# Patient Record
Sex: Female | Born: 1980 | Race: White | Hispanic: Yes | Marital: Married | Smoking: Never smoker
Health system: Southern US, Community
[De-identification: ages and names within clinical notes are randomized; demographics above are authoritative.]

## PROBLEM LIST (undated history)

## (undated) DIAGNOSIS — O24419 Gestational diabetes mellitus in pregnancy, unspecified control: Secondary | ICD-10-CM

## (undated) DIAGNOSIS — A630 Anogenital (venereal) warts: Secondary | ICD-10-CM

## (undated) HISTORY — DX: Anogenital (venereal) warts: A63.0

---

## 2016-06-26 HISTORY — PX: CHOLECYSTECTOMY: SHX55

## 2017-06-26 NOTE — L&D Delivery Note (Addendum)
Patient: Jennifer Bowman MRN: [id number]  GBS status: GBS(+), amp given but incompletely treated  Patient is a 37 y.o. now [id number] s/p NSVD at [redacted]w[redacted]d, who was admitted for SOL. AROM 0h 18m prior to delivery with clear fluid.    Delivery Note At 7:29 AM a viable female was delivered via Vaginal, Spontaneous (Presentation: vertex, ROA).  APGAR: 9, 9; weight  pending.   Placenta status: intact.  Cord: 3 vessel, knotted  with the following complications: PPH 500cc tx w/ methergine.    Anesthesia:  IV fentanyl, local lidocaine Episiotomy: None Lacerations: 2nd degree Suture Repair: 3.0 vicryl Est. Blood Loss (mL): 416  Mom to postpartum.  Baby to Couplet care / Skin to Skin.  JACOB E PERRIN 04/28/2018, 8:07 AM    Head delivered ROA. No nuchal cord present. Shoulder and body delivered in usual fashion. Infant with spontaneous cry, placed on mother's abdomen, dried and bulb suctioned. Cord clamped x 2 after 1-minute delay, and cut by family member. Cord blood drawn. Placenta delivered spontaneously with gentle cord traction. Methergine given for brisk bleeding and history of severe PPH.  Fundus firm with massage and Pitocin. Perineum inspected and found to have 2nd degree laceration which was repaired with 3.0 vicryl with good hemostasis achieved.  OB FELLOW DELIVERY ATTESTATION  I was gloved and present for the delivery in its entirety, and I agree with the above resident's note.    Gwenevere Abbot, MD OB Fellow  04/28/2018, 8:10 AM

## 2018-02-18 LAB — CYTOLOGY - PAP
Cystic Fibrosis Profile: NEGATIVE
DRUG SCREEN, URINE: NEGATIVE
Glucose 1 Hour: 159
Pap Smear: NEGATIVE

## 2018-02-18 LAB — OB RESULTS CONSOLE ABO/RH: RH TYPE: POSITIVE

## 2018-02-18 LAB — OB RESULTS CONSOLE RPR: RPR: NONREACTIVE

## 2018-02-18 LAB — OB RESULTS CONSOLE HGB/HCT, BLOOD
HEMATOCRIT: 35
HEMOGLOBIN: 11.4

## 2018-02-18 LAB — OB RESULTS CONSOLE PLATELET COUNT: Platelets: 266

## 2018-02-18 LAB — OB RESULTS CONSOLE VARICELLA ZOSTER ANTIBODY, IGG: Varicella: IMMUNE

## 2018-02-18 LAB — OB RESULTS CONSOLE GC/CHLAMYDIA
Chlamydia: NEGATIVE
Gonorrhea: NEGATIVE

## 2018-02-18 LAB — OB RESULTS CONSOLE RUBELLA ANTIBODY, IGM: Rubella: IMMUNE

## 2018-02-18 LAB — OB RESULTS CONSOLE HEPATITIS B SURFACE ANTIGEN: Hepatitis B Surface Ag: NEGATIVE

## 2018-02-18 LAB — OB RESULTS CONSOLE ANTIBODY SCREEN: Antibody Screen: NEGATIVE

## 2018-03-07 ENCOUNTER — Encounter: Payer: Self-pay | Admitting: Obstetrics & Gynecology

## 2018-03-07 ENCOUNTER — Encounter: Payer: Self-pay | Admitting: *Deleted

## 2018-03-07 ENCOUNTER — Ambulatory Visit (INDEPENDENT_AMBULATORY_CARE_PROVIDER_SITE_OTHER): Payer: Self-pay | Admitting: Obstetrics & Gynecology

## 2018-03-07 DIAGNOSIS — O09299 Supervision of pregnancy with other poor reproductive or obstetric history, unspecified trimester: Secondary | ICD-10-CM | POA: Insufficient documentation

## 2018-03-07 DIAGNOSIS — O0990 Supervision of high risk pregnancy, unspecified, unspecified trimester: Secondary | ICD-10-CM

## 2018-03-07 DIAGNOSIS — O09529 Supervision of elderly multigravida, unspecified trimester: Secondary | ICD-10-CM

## 2018-03-07 DIAGNOSIS — R8271 Bacteriuria: Secondary | ICD-10-CM | POA: Insufficient documentation

## 2018-03-07 DIAGNOSIS — O09523 Supervision of elderly multigravida, third trimester: Secondary | ICD-10-CM

## 2018-03-07 DIAGNOSIS — O0993 Supervision of high risk pregnancy, unspecified, third trimester: Secondary | ICD-10-CM

## 2018-03-07 DIAGNOSIS — O09293 Supervision of pregnancy with other poor reproductive or obstetric history, third trimester: Secondary | ICD-10-CM

## 2018-03-07 LAB — POCT URINALYSIS DIP (DEVICE)
BILIRUBIN URINE: NEGATIVE
Glucose, UA: NEGATIVE mg/dL
KETONES UR: NEGATIVE mg/dL
Leukocytes, UA: NEGATIVE
Nitrite: NEGATIVE
PH: 6.5 (ref 5.0–8.0)
Protein, ur: NEGATIVE mg/dL
Specific Gravity, Urine: 1.01 (ref 1.005–1.030)
Urobilinogen, UA: 0.2 mg/dL (ref 0.0–1.0)

## 2018-03-07 NOTE — Patient Instructions (Signed)

## 2018-03-07 NOTE — BH Specialist Note (Deleted)
Integrated Behavioral Health Initial Visit  MRN: [id number] Name: Jennifer Bowman  Number of Integrated Behavioral Health Clinician visits:: 1/6 Session Start time: ***  Session End time: *** Total time: {IBH Total Time:21014050}  Type of Service: Integrated Behavioral Health- Individual/Family Interpretor:Yes.   Interpretor Name and Language: Spanish ***   Warm Hand Off Completed.       SUBJECTIVE: Jennifer Bowman is a 37 y.o. female accompanied by {CHL AMB ACCOMPANIED WU:[id number]}[phone]} Patient was referred by Scheryl DarterJames Arnold, MD for Initial OB introduction to integrated behavioral health services . Patient reports the following symptoms/concerns: *** Duration of problem: ***; Severity of problem: {Mild/Moderate/Severe:20260}  OBJECTIVE: Mood: {BHH MOOD:22306} and Affect: {BHH AFFECT:22307} Risk of harm to self or others: {CHL AMB BH Suicide Current Mental Status:21022748}  LIFE CONTEXT: Family and Social: *** School/Work: *** Self-Care: *** Life Changes: Current pregnancy ***  GOALS ADDRESSED: Patient will: 1. Reduce symptoms of: {IBH Symptoms:21014056} 2. Increase knowledge and/or ability of: {IBH Patient Tools:21014057}  3. Demonstrate ability to: {IBH Goals:21014053}  INTERVENTIONS: Interventions utilized: {IBH Interventions:21014054}  Standardized Assessments completed: GAD-7 and PHQ 9  ASSESSMENT: Patient currently experiencing Supervision of *** pregnancy, ***.   Patient may benefit from Initial OB introduction to integrated behavioral health services.  PLAN: 1. Follow up with behavioral health clinician on : *** 2. Behavioral recommendations: *** 3. Referral(s): {IBH Referrals:21014055} 4. "From scale of 1-10, how likely are you to follow plan?": ***  Valetta CloseJamie C Delainie Chavana, LCSW  ***

## 2018-03-07 NOTE — Progress Notes (Signed)
Here for initial prenatal visit- [facility]. Given new patient packets. C/o occasional contractions daily 2-3 times.

## 2018-03-07 NOTE — Progress Notes (Signed)
Subjective:    Jennifer Bowman is a [id number] [redacted]w[redacted]d being seen today for her first obstetrical visit after transfer from [facility] for history of fetal demise with a baby with anomalies, subsequent normal deliveries Her obstetrical history is significant for advanced maternal age and fetal demise and anomalies. Patient does intend to breast feed. Pregnancy history fully reviewed.  Patient reports occasional contractions.  Vitals:   04-05-18 1056 05-Apr-2018 1124  BP: 110/65   Pulse: 86   Weight: 169 lb 11.2 oz (77 kg)   Height:  5\' 3"  (1.6 m)    HISTORY: OB History  Gravida Para Term Preterm AB Living  5 4 3 1   3   SAB TAB Ectopic Multiple Live Births          3    # Outcome Date GA Lbr Len/2nd Weight Sex Delivery Anes PTL Lv  5 Current           4 Term 10/11/08 [redacted]w[redacted]d  8 lb (3.629 kg) M Vag-Spont   LIV     Birth Comments: had outbreak of herpes, pp hemorrhage, received blood, GDM  3 Term 03/04/05 [redacted]w[redacted]d  8 lb 8 oz (3.856 kg)  Vag-Spont   LIV  2 Preterm 06/12/02 [redacted]w[redacted]d    Vag-Spont   FD     Birth Comments: born stillbirth due to fetal anomalies and not allowed to see baby  1 Term 06/04/99 [redacted]w[redacted]d  6 lb 9.8 oz (3 kg)  Vag-Spont   LIV     Birth Comments: N&V    Past Medical History:  Diagnosis Date  . GDM (gestational diabetes mellitus)   . Genital warts    Past Surgical History:  Procedure Laterality Date  . CHOLECYSTECTOMY  2018   Family History  Problem Relation Age of Onset  . Hypertension Sister   . Diabetes Sister   . Heart disease Sister   . Seizures Sister   . Hepatitis Sister   . Thyroid disease Sister      Exam    Uterus:     Pelvic Exam:    Perineum: No Hemorrhoids   Vulva: normal   Vagina:  normal mucosa   pH:     Cervix: no lesions   Adnexa: not evaluated   Bony Pelvis: average       Skin: normal coloration and turgor, no rashes    Neurologic: oriented, normal mood   Extremities: normal strength, tone, and muscle mass   HEENT PERRLA and neck  supple with midline trachea   Mouth/Teeth dental hygiene good   Neck supple   Cardiovascular: regular rate and rhythm   Respiratory:  appears well, vitals normal, no respiratory distress, acyanotic, normal RR   Abdomen: gravid   Urinary: urethral meatus normal      Assessment:    Pregnancy: [id number] Patient Active Problem List   Diagnosis Date Noted  . Supervision of high risk pregnancy, antepartum 04/05/18  . Group B streptococcal bacteriuria 04/05/2018  . History of fetal abnormality in previous pregnancy, currently pregnant Apr 05, 2018  . AMA (advanced maternal age) multigravida 35+ 2018/04/05    Fetal death was associated with anomalies, she has no documentation    Plan:     Initial labs drawn. Prenatal vitamins. Problem list reviewed and updated. Genetic Screening discussed Quad Screen: results reviewed.  Ultrasound discussed; fetal survey: results reviewed.  Follow up in 2 weeks. 50% of 30 min visit spent on counseling and coordination of care.  Will order MFM [place] for growth, review anatomy  Scheryl DarterJames Zimri Brennen 03/07/2018

## 2018-03-11 ENCOUNTER — Ambulatory Visit (HOSPITAL_COMMUNITY)
Admission: RE | Admit: 2018-03-11 | Discharge: 2018-03-11 | Disposition: A | Discharge status: Home / Self Care | Payer: Self-pay | Source: Ambulatory Visit | Attending: Obstetrics & Gynecology | Admitting: Obstetrics & Gynecology

## 2018-03-11 ENCOUNTER — Encounter (HOSPITAL_COMMUNITY): Payer: Self-pay

## 2018-03-11 DIAGNOSIS — Z363 Encounter for antenatal screening for malformations: Secondary | ICD-10-CM

## 2018-03-11 DIAGNOSIS — O09299 Supervision of pregnancy with other poor reproductive or obstetric history, unspecified trimester: Secondary | ICD-10-CM

## 2018-03-11 DIAGNOSIS — O09523 Supervision of elderly multigravida, third trimester: Secondary | ICD-10-CM | POA: Insufficient documentation

## 2018-03-11 DIAGNOSIS — O0990 Supervision of high risk pregnancy, unspecified, unspecified trimester: Secondary | ICD-10-CM

## 2018-03-11 DIAGNOSIS — O09293 Supervision of pregnancy with other poor reproductive or obstetric history, third trimester: Secondary | ICD-10-CM | POA: Insufficient documentation

## 2018-03-11 DIAGNOSIS — O0993 Supervision of high risk pregnancy, unspecified, third trimester: Secondary | ICD-10-CM | POA: Insufficient documentation

## 2018-03-11 DIAGNOSIS — Z3A33 33 weeks gestation of pregnancy: Secondary | ICD-10-CM | POA: Insufficient documentation

## 2018-03-11 DIAGNOSIS — O09529 Supervision of elderly multigravida, unspecified trimester: Secondary | ICD-10-CM

## 2018-03-11 HISTORY — DX: Gestational diabetes mellitus in pregnancy, unspecified control: O24.419

## 2018-03-12 ENCOUNTER — Other Ambulatory Visit (HOSPITAL_COMMUNITY): Payer: Self-pay | Admitting: *Deleted

## 2018-03-12 DIAGNOSIS — O09529 Supervision of elderly multigravida, unspecified trimester: Secondary | ICD-10-CM

## 2018-03-20 ENCOUNTER — Ambulatory Visit (INDEPENDENT_AMBULATORY_CARE_PROVIDER_SITE_OTHER): Payer: Self-pay | Admitting: Family Medicine

## 2018-03-20 VITALS — BP 117/70 | HR 97 | Wt 172.0 lb

## 2018-03-20 DIAGNOSIS — N898 Other specified noninflammatory disorders of vagina: Secondary | ICD-10-CM

## 2018-03-20 DIAGNOSIS — O98313 Other infections with a predominantly sexual mode of transmission complicating pregnancy, third trimester: Secondary | ICD-10-CM

## 2018-03-20 DIAGNOSIS — A6009 Herpesviral infection of other urogenital tract: Secondary | ICD-10-CM

## 2018-03-20 DIAGNOSIS — Z23 Encounter for immunization: Secondary | ICD-10-CM

## 2018-03-20 DIAGNOSIS — O09529 Supervision of elderly multigravida, unspecified trimester: Secondary | ICD-10-CM

## 2018-03-20 DIAGNOSIS — Z113 Encounter for screening for infections with a predominantly sexual mode of transmission: Secondary | ICD-10-CM

## 2018-03-20 DIAGNOSIS — N9089 Other specified noninflammatory disorders of vulva and perineum: Secondary | ICD-10-CM

## 2018-03-20 DIAGNOSIS — O2340 Unspecified infection of urinary tract in pregnancy, unspecified trimester: Secondary | ICD-10-CM

## 2018-03-20 DIAGNOSIS — R8271 Bacteriuria: Secondary | ICD-10-CM

## 2018-03-20 DIAGNOSIS — O0990 Supervision of high risk pregnancy, unspecified, unspecified trimester: Secondary | ICD-10-CM

## 2018-03-20 NOTE — Progress Notes (Signed)
   PRENATAL VISIT NOTE  Subjective:  Jennifer Bowman is a 37 y.o. [id number] at 160w3d being seen today for ongoing prenatal care.  She is currently monitored for the following issues for this high-risk pregnancy and has Supervision of high risk pregnancy, antepartum; Group B streptococcal bacteriuria; History of fetal abnormality in previous pregnancy, currently pregnant; and AMA (advanced maternal age) multigravida 35+ on their problem list.  - will plan to get flu vaccine at HD  - tear in vagina ? - burning with wiping, no d/c or bleeding  - when had daughter (13yo) - large tear in vaginal area  - hx of herpes - on suppression, this pain feels different   Patient reports vaginal burning, lower pelvic pain.  Contractions: Irregular. Vag. Bleeding: None.  Movement: Present. Denies leaking of fluid.   The following portions of the patient's history were reviewed and updated as appropriate: allergies, current medications, past family history, past medical history, past social history, past surgical history and problem list. Problem list updated.  Objective:   Vitals:   03/20/18 1424  BP: 117/70  Pulse: 97  Weight: 172 lb (78 kg)    Fetal Status: Fetal Heart Rate (bpm): 146   Movement: Present     General:  Alert, oriented and cooperative. Patient is in no acute distress.  Skin: Skin is warm and dry. No rash noted.   Cardiovascular: Normal heart rate noted  Respiratory: Normal respiratory effort, no problems with respiration noted  Abdomen: Soft, gravid, appropriate for gestational age.  Pain/Pressure: Present     Pelvic: Cervical exam deferred      Sterile speculum exam with scant white/clear discharge, cervix visually closed, normal-appearing vagina  two small superficial lacerations just at entry to vagina, no bleeding    Extremities: Normal range of motion.  Edema: Trace  Mental Status: Normal mood and affect. Normal behavior. Normal judgment and thought content.   Assessment  and Plan:  Pregnancy: [id number] at 3060w3d  1. Supervision of high risk pregnancy, antepartum -- prenatal record reviewed and UTD -- Tdap vaccine greater than or equal to 7yo IM -- flu vaccine at HD   2. Asymptomatic Bacteriuria (GBS): Treated with amoxicillin early in pregnancy. -- repeat urine culture   3. Vaginal Discharge: Low suspicion for infection. Follow-up G/C, wet prep swabs.   4. Vaginal Fissure/Superficial Laceration: Likely related to trauma from rubbing with rough towel. Recommended barrier of Vaseline  And reviewed return precautions. Does not appear to be consistent with new herpes outbreak.   Preterm labor symptoms and general obstetric precautions including but not limited to vaginal bleeding, contractions, leaking of fluid and fetal movement were reviewed in detail with the patient. Please refer to After Visit Summary for other counseling recommendations.  Return in about 2 weeks (around 04/03/2018) for ROB.  Future Appointments  Date Time Provider Department Center  04/04/2018 11:15 AM Adam PhenixArnold, James G, [facility]  04/08/2018 10:45 AM [facility] US 2 [facility]   Tamera StandsLaurel S Raul Torrance, DO

## 2018-03-20 NOTE — Patient Instructions (Signed)
We will call you if any of your results are abnormal.   Try vaseline on the cut near your vagina. Apply new vaseline barrier after every time your wipe.   Try drinking more to help with false contractions.

## 2018-03-21 DIAGNOSIS — O98319 Other infections with a predominantly sexual mode of transmission complicating pregnancy, unspecified trimester: Secondary | ICD-10-CM

## 2018-03-21 DIAGNOSIS — A6009 Herpesviral infection of other urogenital tract: Secondary | ICD-10-CM | POA: Insufficient documentation

## 2018-03-22 LAB — CULTURE, OB URINE

## 2018-03-22 LAB — CERVICOVAGINAL ANCILLARY ONLY
Bacterial vaginitis: NEGATIVE
CHLAMYDIA, DNA PROBE: NEGATIVE
Candida vaginitis: NEGATIVE
NEISSERIA GONORRHEA: NEGATIVE
TRICH (WINDOWPATH): NEGATIVE

## 2018-03-22 LAB — URINE CULTURE, OB REFLEX

## 2018-04-04 ENCOUNTER — Ambulatory Visit (INDEPENDENT_AMBULATORY_CARE_PROVIDER_SITE_OTHER): Payer: Self-pay | Admitting: Obstetrics & Gynecology

## 2018-04-04 VITALS — BP 106/69 | HR 97 | Wt 176.0 lb

## 2018-04-04 DIAGNOSIS — O0993 Supervision of high risk pregnancy, unspecified, third trimester: Secondary | ICD-10-CM

## 2018-04-04 DIAGNOSIS — O0990 Supervision of high risk pregnancy, unspecified, unspecified trimester: Secondary | ICD-10-CM

## 2018-04-04 DIAGNOSIS — R8271 Bacteriuria: Secondary | ICD-10-CM

## 2018-04-04 NOTE — Progress Notes (Signed)
   PRENATAL VISIT NOTE  Subjective:  Jennifer Bowman is a 37 y.o. [id number] at [redacted]w[redacted]d being seen today for ongoing prenatal care.  She is currently monitored for the following issues for this low-risk pregnancy and has Supervision of high risk pregnancy, antepartum; Group B streptococcal bacteriuria; History of fetal abnormality in previous pregnancy, currently pregnant; AMA (advanced maternal age) multigravida 35+; and Genital herpes affecting pregnancy on their problem list.  Patient reports occasional contractions.  Contractions: Irregular. Vag. Bleeding: None.  Movement: Present. Denies leaking of fluid.   The following portions of the patient's history were reviewed and updated as appropriate: allergies, current medications, past family history, past medical history, past social history, past surgical history and problem list. Problem list updated.  Objective:   Vitals:   04/04/18 1125  BP: 106/69  Pulse: 97  Weight: 176 lb (79.8 kg)    Fetal Status: Fetal Heart Rate (bpm): 130   Movement: Present     General:  Alert, oriented and cooperative. Patient is in no acute distress.  Skin: Skin is warm and dry. No rash noted.   Cardiovascular: Normal heart rate noted  Respiratory: Normal respiratory effort, no problems with respiration noted  Abdomen: Soft, gravid, appropriate for gestational age.  Pain/Pressure: Present     Pelvic: Cervical exam performed        Extremities: Normal range of motion.  Edema: Trace  Mental Status: Normal mood and affect. Normal behavior. Normal judgment and thought content.   Assessment and Plan:  Pregnancy: A5W0981 at [redacted]w[redacted]d  1. Supervision of high risk pregnancy, antepartum   2. Group B streptococcal bacteriuria Discussed prophylaxis  Preterm labor symptoms and general obstetric precautions including but not limited to vaginal bleeding, contractions, leaking of fluid and fetal movement were reviewed in detail with the patient. Please refer to  After Visit Summary for other counseling recommendations.  Return in about 1 week (around 04/11/2018).  Future Appointments  Date Time Provider Department Center  04/08/2018 10:45 AM [facility] [place] 2 [facility]    Scheryl Darter, MD

## 2018-04-04 NOTE — Progress Notes (Signed)
Pt states has been feeling very tired. Yesterday was having contraction.Deberah Pelton.

## 2018-04-04 NOTE — Patient Instructions (Addendum)
Vaginal Delivery Vaginal delivery means that you will give birth by pushing your baby out of your birth canal (vagina). A team of health care providers will help you before, during, and after vaginal delivery. Birth experiences are unique for every woman and every pregnancy, and birth experiences vary depending on where you choose to give birth. What should I do to prepare for my baby's birth? Before your baby is born, it is important to talk with your health care provider about:  Your labor and delivery preferences. These may include: ? Medicines that you may be given. ? How you will manage your pain. This might include non-medical pain relief techniques or injectable pain relief such as epidural analgesia. ? How you and your baby will be monitored during labor and delivery. ? Who may be in the labor and delivery room with you. ? Your feelings about surgical delivery of your baby (cesarean delivery, or C-section) if this becomes necessary. ? Your feelings about receiving donated blood through an IV tube (blood transfusion) if this becomes necessary.  Whether you are able: ? To take pictures or videos of the birth. ? To eat during labor and delivery. ? To move around, walk, or change positions during labor and delivery.  What to expect after your baby is born, such as: ? Whether delayed umbilical cord clamping and cutting is offered. ? Who will care for your baby right after birth. ? Medicines or tests that may be recommended for your baby. ? Whether breastfeeding is supported in your hospital or birth center. ? How long you will be in the hospital or birth center.  How any medical conditions you have may affect your baby or your labor and delivery experience.  To prepare for your baby's birth, you should also:  Attend all of your health care visits before delivery (prenatal visits) as recommended by your health care provider. This is important.  Prepare your home for your baby's  arrival. Make sure that you have: ? Diapers. ? Baby clothing. ? Feeding equipment. ? Safe sleeping arrangements for you and your baby.  Install a car seat in your vehicle. Have your car seat checked by a certified car seat installer to make sure that it is installed safely.  Think about who will help you with your new baby at home for at least the first several weeks after delivery.  What can I expect when I arrive at the birth center or hospital? Once you are in labor and have been admitted into the hospital or birth center, your health care provider may:  Review your pregnancy history and any concerns you have.  Insert an IV tube into one of your veins. This is used to give you fluids and medicines.  Check your blood pressure, pulse, temperature, and heart rate (vital signs).  Check whether your bag of water (amniotic sac) has broken (ruptured).  Talk with you about your birth plan and discuss pain control options.  Monitoring Your health care provider may monitor your contractions (uterine monitoring) and your baby's heart rate (fetal monitoring). You may need to be monitored:  Often, but not continuously (intermittently).  All the time or for long periods at a time (continuously). Continuous monitoring may be needed if: ? You are taking certain medicines, such as medicine to relieve pain or make your contractions stronger. ? You have pregnancy or labor complications.  Monitoring may be done by:  Placing a special stethoscope or a handheld monitoring device on your abdomen to   check your baby's heartbeat, and feeling your abdomen for contractions. This method of monitoring does not continuously record your baby's heartbeat or your contractions.  Placing monitors on your abdomen (external monitors) to record your baby's heartbeat and the frequency and length of contractions. You may not have to wear external monitors all the time.  Placing monitors inside of your uterus  (internal monitors) to record your baby's heartbeat and the frequency, length, and strength of your contractions. ? Your health care provider may use internal monitors if he or she needs more information about the strength of your contractions or your baby's heart rate. ? Internal monitors are put in place by passing a thin, flexible wire through your vagina and into your uterus. Depending on the type of monitor, it may remain in your uterus or on your baby's head until birth. ? Your health care provider will discuss the benefits and risks of internal monitoring with you and will ask for your permission before inserting the monitors.  Telemetry. This is a type of continuous monitoring that can be done with external or internal monitors. Instead of having to stay in bed, you are able to move around during telemetry. Ask your health care provider if telemetry is an option for you.  Physical exam Your health care provider may perform a physical exam. This may include:  Checking whether your baby is positioned: ? With the head toward your vagina (head-down). This is most common. ? With the head toward the top of your uterus (head-up or breech). If your baby is in a breech position, your health care provider may try to turn your baby to a head-down position so you can deliver vaginally. If it does not seem that your baby can be born vaginally, your provider may recommend surgery to deliver your baby. In rare cases, you may be able to deliver vaginally if your baby is head-up (breech delivery). ? Lying sideways (transverse). Babies that are lying sideways cannot be delivered vaginally.  Checking your cervix to determine: ? Whether it is thinning out (effacing). ? Whether it is opening up (dilating). ? How low your baby has moved into your birth canal.  What are the three stages of labor and delivery?  Normal labor and delivery is divided into the following three stages: Stage 1  Stage 1 is the  longest stage of labor, and it can last for hours or days. Stage 1 includes: ? Early labor. This is when contractions may be irregular, or regular and mild. Generally, early labor contractions are more than 10 minutes apart. ? Active labor. This is when contractions get longer, more regular, more frequent, and more intense. ? The transition phase. This is when contractions happen very close together, are very intense, and may last longer than during any other part of labor.  Contractions generally feel mild, infrequent, and irregular at first. They get stronger, more frequent (about every 2-3 minutes), and more regular as you progress from early labor through active labor and transition.  Many women progress through stage 1 naturally, but you may need help to continue making progress. If this happens, your health care provider may talk with you about: ? Rupturing your amniotic sac if it has not ruptured yet. ? Giving you medicine to help make your contractions stronger and more frequent.  Stage 1 ends when your cervix is completely dilated to 4 inches (10 cm) and completely effaced. This happens at the end of the transition phase. Stage 2  Once   your cervix is completely effaced and dilated to 4 inches (10 cm), you may start to feel an urge to push. It is common for the body to naturally take a rest before feeling the urge to push, especially if you received an epidural or certain other pain medicines. This rest period may last for up to 1-2 hours, depending on your unique labor experience.  During stage 2, contractions are generally less painful, because pushing helps relieve contraction pain. Instead of contraction pain, you may feel stretching and burning pain, especially when the widest part of your baby's head passes through the vaginal opening (crowning).  Your health care provider will closely monitor your pushing progress and your baby's progress through the vagina during stage 2.  Your  health care provider may massage the area of skin between your vaginal opening and anus (perineum) or apply warm compresses to your perineum. This helps it stretch as the baby's head starts to crown, which can help prevent perineal tearing. ? In some cases, an incision may be made in your perineum (episiotomy) to allow the baby to pass through the vaginal opening. An episiotomy helps to make the opening of the vagina larger to allow more room for the baby to fit through.  It is very important to breathe and focus so your health care provider can control the delivery of your baby's head. Your health care provider may have you decrease the intensity of your pushing, to help prevent perineal tearing.  After delivery of your baby's head, the shoulders and the rest of the body generally deliver very quickly and without difficulty.  Once your baby is delivered, the umbilical cord may be cut right away, or this may be delayed for 1-2 minutes, depending on your baby's health. This may vary among health care providers, hospitals, and birth centers.  If you and your baby are healthy enough, your baby may be placed on your chest or abdomen to help maintain the baby's temperature and to help you bond with each other. Some mothers and babies start breastfeeding at this time. Your health care team will dry your baby and help keep your baby warm during this time.  Your baby may need immediate care if he or she: ? Showed signs of distress during labor. ? Has a medical condition. ? Was born too early (prematurely). ? Had a bowel movement before birth (meconium). ? Shows signs of difficulty transitioning from being inside the uterus to being outside of the uterus. If you are planning to breastfeed, your health care team will help you begin a feeding. Stage 3  The third stage of labor starts immediately after the birth of your baby and ends after you deliver the placenta. The placenta is an organ that develops  during pregnancy to provide oxygen and nutrients to your baby in the womb.  Delivering the placenta may require some pushing, and you may have mild contractions. Breastfeeding can stimulate contractions to help you deliver the placenta.  After the placenta is delivered, your uterus should tighten (contract) and become firm. This helps to stop bleeding in your uterus. To help your uterus contract and to control bleeding, your health care provider may: ? Give you medicine by injection, through an IV tube, by mouth, or through your rectum (rectally). ? Massage your abdomen or perform a vaginal exam to remove any blood clots that are left in your uterus. ? Empty your bladder by placing a thin, flexible tube (catheter) into your bladder. ? Encourage   you to breastfeed your baby. After labor is over, you and your baby will be monitored closely to ensure that you are both healthy until you are ready to go home. Your health care team will teach you how to care for yourself and your baby. This information is not intended to replace advice given to you by your health care provider. Make sure you discuss any questions you have with your health care provider. Document Released: 03/21/2008 Document Revised: 12/31/2015 Document Reviewed: 06/27/2015 Elsevier Interactive Patient Education  2018 Elsevier Inc.  Infeccin por estreptococo del grupoB durante el embarazo (Group B Streptococcus Infection During Pregnancy) El estreptococo del grupoB es un tipo de bacteria (EGB) (Streptococcus agalactiae) que suele encontrarse en personas saludables, comnmente en el recto, la vagina y los intestinos. En personas saludables y en mujeres no embarazadas, rara vez la bacteria provoca una enfermedad o complicaciones graves. Sin embargo, las mujeres [place] de EGB es positiva durante el embarazo pueden transmitirle la bacteria al beb en el parto; esto puede provocar una infeccin grave en el beb despus del parto. Las  mujeres con EGB tambin pueden tener infecciones durante el Psychiatrist o inmediatamente despus del [place], como infecciones de las vas urinarias (IVU) o infecciones del tero (infecciones uterinas). Tener EGB tambin [employer] riesgo de complicaciones durante el [place], como parto o trabajo de parto prematuros, aborto espontneo o muerte fetal. Se recomienda que todas las embarazadas se hagan pruebas (exmenes de deteccin) de rutina para determinar la presencia del estreptococo del grupoB. FACTORES DE RIESGO Puede tener un mayor riesgo de contraer una infeccin por estreptococo del grupoB durante el embarazo si ya le ocurri en un embarazo previo. SNTOMAS En la [employer], la infeccin por EGB no causa sntomas en las [place]. Los signos y sntomas de una posible infeccin relacionada con el EGB pueden incluir los siguientes:  Inicio del [place] de parto antes de la semana37 de gestacin.  Una infeccin de las vas urinarias o de la vejiga, que puede provocar: ? [place] Ruts. ? Sensacin de dolor o ardor al [employer]. ? Ganas frecuentes de orinar.  Fiebre durante el [place] de [place], junto con: ? Una secrecin con mal olor. ? Dolor a Public affairs consultant. ? Frecuencia cardaca acelerada en la madre, en el beb o en ambos. Los sntomas poco frecuentes pero graves de una posible infeccin relacionada con el EGB en las mujeres incluyen:  Infeccin en la sangre (septicemia). Esto puede provocar fiebre, escalofros o confusin.  Infeccin pulmonar (neumona). Esto puede provocar fiebre, escalofros, tos, respiracin rpida, dificultad para respirar o Journalist, newspaper.  Infeccin en los huesos, las articulaciones, la piel o los tejidos blandos. DIAGNSTICO Le pueden realizar exmenes para detectar el EGB entre la semana35 y la semana37 de gestacin. Si tiene sntomas de trabajo de Sport and exercise psychologist, Fish farm manager los exmenes de deteccin antes. Esta afeccin se diagnostica a  travs de los [place] de laboratorio de:  Un hisopo con lquido de la vagina y Administrator.  Lauris Poag de [place]. TRATAMIENTO Esta enfermedad se trata con antibiticos. Al comenzar el trabajo de parto o apenas rompe bolsa (ruptura de las [place]), le administrarn antibiticos a travs de una va intravenosa (IV). Seguir con antibiticos hasta despus del parto. Si tiene un parto por cesrea, no necesita antibiticos salvo que las membranas ya se hayan roto. INSTRUCCIONES PARA EL CUIDADO EN EL HOGAR  Tome los medicamentos de venta libre y los recetados solamente como  se lo haya indicado el mdico.  Tome los antibiticos como se lo haya indicado el mdico. No deje de tomar los antibiticos aunque comience a sentirse mejor.  Concurra a todas las visitas previas al parto (prenatales) y las visitas de control como se lo haya indicado el mdico. Esto es importante. SOLICITE ATENCIN MDICA SI:  Siente dolor o ardor al Geographical information systems officer.  Tiene necesidad de orinar con frecuencia.  Tiene fiebre o siente escalofros.  Tiene una secrecin vaginal con mal olor. SOLICITE ATENCIN MDICA DE INMEDIATO SI:  Se produce la ruptura de las [place].  Comienza el trabajo de [place].  Siente un dolor intenso en el abdomen.  Tiene dificultad para respirar.  Siente dolor en el pecho. Esta informacin no tiene Theme park manager el consejo del mdico. Asegrese de hacerle al mdico cualquier pregunta que tenga. Document Released: 11/29/2007 Document Revised: 06/17/2013 Document Reviewed: 01/06/2016 Elsevier Interactive Patient Education  [employer].

## 2018-04-08 ENCOUNTER — Ambulatory Visit (HOSPITAL_COMMUNITY)
Admission: RE | Admit: 2018-04-08 | Discharge: 2018-04-08 | Disposition: A | Discharge status: Home / Self Care | Payer: Self-pay | Source: Ambulatory Visit | Attending: Obstetrics & Gynecology | Admitting: Obstetrics & Gynecology

## 2018-04-15 ENCOUNTER — Encounter: Payer: Self-pay | Admitting: Obstetrics & Gynecology

## 2018-04-15 NOTE — Progress Notes (Deleted)
   Patient did not show up today for her scheduled appointment.   Danuel Felicetti, MD, FACOG Obstetrician & Gynecologist, [facility], [facility]  

## 2018-04-16 ENCOUNTER — Other Ambulatory Visit (HOSPITAL_COMMUNITY): Payer: Self-pay | Admitting: Obstetrics and Gynecology

## 2018-04-16 ENCOUNTER — Ambulatory Visit (HOSPITAL_COMMUNITY)
Admission: RE | Admit: 2018-04-16 | Discharge: 2018-04-16 | Disposition: A | Discharge status: Home / Self Care | Payer: Self-pay | Source: Ambulatory Visit | Attending: Obstetrics and Gynecology | Admitting: Obstetrics and Gynecology

## 2018-04-16 ENCOUNTER — Encounter (HOSPITAL_COMMUNITY): Payer: Self-pay

## 2018-04-16 ENCOUNTER — Ambulatory Visit (HOSPITAL_COMMUNITY)
Admission: RE | Admit: 2018-04-16 | Discharge: 2018-04-16 | Disposition: A | Discharge status: Home / Self Care | Payer: Self-pay | Source: Ambulatory Visit | Attending: Obstetrics & Gynecology | Admitting: Obstetrics & Gynecology

## 2018-04-16 DIAGNOSIS — O0990 Supervision of high risk pregnancy, unspecified, unspecified trimester: Secondary | ICD-10-CM

## 2018-04-16 DIAGNOSIS — O09529 Supervision of elderly multigravida, unspecified trimester: Secondary | ICD-10-CM

## 2018-04-16 DIAGNOSIS — Z3A38 38 weeks gestation of pregnancy: Secondary | ICD-10-CM | POA: Insufficient documentation

## 2018-04-16 DIAGNOSIS — O09293 Supervision of pregnancy with other poor reproductive or obstetric history, third trimester: Secondary | ICD-10-CM

## 2018-04-16 DIAGNOSIS — Z362 Encounter for other antenatal screening follow-up: Secondary | ICD-10-CM

## 2018-04-16 DIAGNOSIS — O09299 Supervision of pregnancy with other poor reproductive or obstetric history, unspecified trimester: Secondary | ICD-10-CM

## 2018-04-16 DIAGNOSIS — O09523 Supervision of elderly multigravida, third trimester: Secondary | ICD-10-CM | POA: Insufficient documentation

## 2018-04-16 DIAGNOSIS — R8271 Bacteriuria: Secondary | ICD-10-CM

## 2018-04-16 DIAGNOSIS — O3660X0 Maternal care for excessive fetal growth, unspecified trimester, not applicable or unspecified: Secondary | ICD-10-CM

## 2018-04-16 NOTE — ED Notes (Signed)
[employer] # [phone] utilized for RN intake.

## 2018-04-17 ENCOUNTER — Encounter: Payer: Self-pay | Admitting: Obstetrics & Gynecology

## 2018-04-17 ENCOUNTER — Ambulatory Visit (INDEPENDENT_AMBULATORY_CARE_PROVIDER_SITE_OTHER): Payer: Self-pay | Admitting: Obstetrics & Gynecology

## 2018-04-17 VITALS — BP 112/71 | HR 85 | Wt 179.0 lb

## 2018-04-17 DIAGNOSIS — O98319 Other infections with a predominantly sexual mode of transmission complicating pregnancy, unspecified trimester: Secondary | ICD-10-CM

## 2018-04-17 DIAGNOSIS — A6009 Herpesviral infection of other urogenital tract: Secondary | ICD-10-CM

## 2018-04-17 DIAGNOSIS — R8271 Bacteriuria: Secondary | ICD-10-CM

## 2018-04-17 DIAGNOSIS — O0990 Supervision of high risk pregnancy, unspecified, unspecified trimester: Secondary | ICD-10-CM

## 2018-04-17 NOTE — Patient Instructions (Signed)
Regrese a la clinica cuando tenga su cita. Si tiene problemas o preguntas, llama a la clinica o vaya a la sala de emergencia al [facility].    

## 2018-04-17 NOTE — Progress Notes (Signed)
PRENATAL VISIT NOTE  Subjective:  Jennifer Bowman is a 37 y.o. [id number] at [redacted]w[redacted]d being seen today for ongoing prenatal care.  She is currently monitored for the following issues for this high-risk pregnancy and has Supervision of high risk pregnancy, antepartum; Group B streptococcal bacteriuria; History of fetal abnormality in previous pregnancy resulting in fetal demise, currently pregnant; AMA (advanced maternal age) multigravida 35+; and Genital herpes affecting pregnancy on their problem list.  Patient reports no complaints.  Contractions: Irregular. Vag. Bleeding: None.  Movement: Present. Denies leaking of fluid.   The following portions of the patient's history were reviewed and updated as appropriate: allergies, current medications, past family history, past medical history, past social history, past surgical history and problem list. Problem list updated.  Objective:   Vitals:   04/17/18 1035  BP: 112/71  Pulse: 85  Weight: 179 lb (81.2 kg)    Fetal Status: Fetal Heart Rate (bpm): 142   Movement: Present     General:  Alert, oriented and cooperative. Patient is in no acute distress.  Skin: Skin is warm and dry. No rash noted.   Cardiovascular: Normal heart rate noted  Respiratory: Normal respiratory effort, no problems with respiration noted  Abdomen: Soft, gravid, appropriate for gestational age.  Pain/Pressure: Present     Pelvic: Cervical exam deferred        Extremities: Normal range of motion.  Edema: None  Mental Status: Normal mood and affect. Normal behavior. Normal judgment and thought content.   [place] Mfm Fetal Bpp Wo Non Stress  Result Date: 04/16/2018 ----------------------------------------------------------------------  OBSTETRICS REPORT                       (Signed Final 04/16/2018 04:59 pm) ---------------------------------------------------------------------- Patient Info  ID #:       [id number]                          D.O.B.:  1980/09/17 (37 yrs)   Name:       Jennifer Bowman           Visit Date: 04/16/2018 03:12 pm ---------------------------------------------------------------------- Performed By  Performed By:     Lenise Arena        Ref. Address:     [address]                                                             [place], [place]  [address]  Attending:        Noralee Space MD        Location:         [facility]  Referred By:      Adam Phenix                    MD ---------------------------------------------------------------------- Orders   #  Description                          Code         Ordered By   1  [place] MFM OB FOLLOW UP                  [phone]     RAVI SHANKAR   2  [place] MFM FETAL BPP WO NON              76819.01     RAVI [facility]      STRESS  ----------------------------------------------------------------------   #  Order #                    Accession #                 Episode #   1  [id number]                  [id number]                  [id number]   2  [id number]                  [id number]                  [id number]  ---------------------------------------------------------------------- Indications   [redacted] weeks gestation of pregnancy                Z3A.38   Poor obstetrical history (fetal demise @ 28    O09.299   weeks - baby with anomalies)   Encounter for Strep B                          Z36.85   Advanced maternal age multigravida 72+,        O37.523   third trimester   Poor obstetric history: Previous gestational   O09.299   diabetes   Encounter for other antenatal screening        Z36.2   follow-up   Macrosomia                                     O36.60X0  ---------------------------------------------------------------------- Vital Signs  Weight (lb): 195                               Height:        5'3"  BMI:         34.54  ---------------------------------------------------------------------- Fetal Evaluation  Num Of Fetuses:         1  Fetal Heart Rate(bpm):  143  Cardiac Activity:       Observed  Presentation:           Cephalic  Placenta:               Posterior  P. Cord Insertion:  Previously Visualized  Amniotic Fluid  AFI FV:      Within normal limits  AFI Sum(cm)     %Tile       Largest Pocket(cm)  13.26           51          4.16  RUQ(cm)       RLQ(cm)       LUQ(cm)        LLQ(cm)  2.4           2.9           4.16           3.8 ---------------------------------------------------------------------- Biometry  BPD:      89.8  mm     G. Age:  36w 3d         24  %    CI:        77.42   %    70 - 86                                                          FL/HC:      21.4   %    20.9 - 22.7  HC:      323.1  mm     G. Age:  36w 4d          6  %    HC/AC:      0.83        0.92 - 1.05  AC:      391.5  mm     G. Age:  N/A          > 97  %    FL/BPD:     77.1   %    71 - 87  FL:       69.2  mm     G. Age:  35w 4d          4  %    FL/AC:      17.7   %    20 - 24  HUM:        62  mm     G. Age:  35w 6d         32  %  Est. FW:    3973  gm    8 lb 12 oz    > 90  % ---------------------------------------------------------------------- OB History  Gravidity:    5         Term:   3        Prem:   1  Living:       3 ---------------------------------------------------------------------- Gestational Age  U/S Today:     36w 1d                                        EDD:   05/13/18  Best:          38w 2d     Det. By:  Previous Ultrasound      EDD:   04/28/18                                      (  09/19/17) ---------------------------------------------------------------------- Anatomy  Cranium:               Appears normal         Aortic Arch:            Previously seen  Cavum:                 Appears normal         Ductal Arch:            Not well visualized  Ventricles:            Appears normal         Diaphragm:              Appears normal   Choroid Plexus:        Previously seen        Stomach:                Appears normal, left                                                                        sided  Cerebellum:            Previously seen        Abdomen:                Appears normal  Posterior Fossa:       Previously seen        Abdominal Wall:         Previously seen  Nuchal Fold:           Not applicable (>20    Cord Vessels:           Previously seen                         wks GA)  Face:                  Orbits and profile     Kidneys:                Appear normal                         previously seen  Lips:                  Previously seen        Bladder:                Appears normal  Thoracic:              Appears normal         Spine:                  Limited views                                                                        appear  normal  Heart:                 Previously seen        Upper Extremities:      Previously seen  RVOT:                  Previously seen        Lower Extremities:      Previously seen  LVOT:                  Previously seen  Other:  Female gender Technically difficult due to advanced GA and fetal          position. ---------------------------------------------------------------------- Cervix Uterus Adnexa  Cervix  Not visualized (advanced GA >24wks) ---------------------------------------------------------------------- Impression  The estimated fetal weight is at greater than the 90th  percentile. Abdominal circumference measures at greater  than the 95th percentile. Amniotic fluid is normal and good  fetal activity is seen. History of GDM. ---------------------------------------------------------------------- Recommendations  Antenatal testing next week if undelivered. ----------------------------------------------------------------------                  Noralee Space, MD Electronically Signed Final Report   04/16/2018 04:59 pm  ----------------------------------------------------------------------  [place] Mfm Ob Follow Up  Result Date: 04/16/2018 ----------------------------------------------------------------------  OBSTETRICS REPORT                       (Signed Final 04/16/2018 04:59 pm) ---------------------------------------------------------------------- Patient Info  ID #:       [id number]                          D.O.B.:  [birth year] (37 yrs)  Name:       Jennifer Bowman           Visit Date: 04/16/2018 03:12 pm ---------------------------------------------------------------------- Performed By  Performed By:     Lenise Arena        Ref. Address:     [address]                                                             [place], [place]                                                             [address]  Attending:        Noralee Space MD        Location:         [facility]  Referred By:      Adam Phenix  MD ---------------------------------------------------------------------- Orders   #  Description                          Code         Ordered By   1  [place] MFM OB FOLLOW UP                  76816.01     RAVI SHANKAR   2  [place] MFM FETAL BPP WO NON              76819.01     RAVI [facility]      STRESS  ----------------------------------------------------------------------   #  Order #                    Accession #                 Episode #   1  [id number]                  [id number]                  [id number]   2  [id number]                  [id number]                  [id number]  ---------------------------------------------------------------------- Indications   [redacted] weeks gestation of pregnancy                Z3A.38   Poor obstetrical history (fetal demise @ 28    O09.299   weeks - baby with anomalies)   Encounter for Strep B                          Z36.85   Advanced maternal age multigravida 73+,        O11.523    third trimester   Poor obstetric history: Previous gestational   O09.299   diabetes   Encounter for other antenatal screening        Z36.2   follow-up   Macrosomia                                     O36.60X0  ---------------------------------------------------------------------- Vital Signs  Weight (lb): 195                               Height:        5'3"  BMI:         34.54 ---------------------------------------------------------------------- Fetal Evaluation  Num Of Fetuses:         1  Fetal Heart Rate(bpm):  143  Cardiac Activity:       Observed  Presentation:           Cephalic  Placenta:               Posterior  P. Cord Insertion:      Previously Visualized  Amniotic Fluid  AFI FV:      Within normal limits  AFI Sum(cm)     %Tile       Largest Pocket(cm)  13.26           51          4.16  RUQ(cm)  RLQ(cm)       LUQ(cm)        LLQ(cm)  2.4           2.9           4.16           3.8 ---------------------------------------------------------------------- Biometry  BPD:      89.8  mm     G. Age:  36w 3d         24  %    CI:        77.42   %    70 - 86                                                          FL/HC:      21.4   %    20.9 - 22.7  HC:      323.1  mm     G. Age:  36w 4d          6  %    HC/AC:      0.83        0.92 - 1.05  AC:      391.5  mm     G. Age:  N/A          > 97  %    FL/BPD:     77.1   %    71 - 87  FL:       69.2  mm     G. Age:  35w 4d          4  %    FL/AC:      17.7   %    20 - 24  HUM:        62  mm     G. Age:  35w 6d         32  %  Est. FW:    3973  gm    8 lb 12 oz    > 90  % ---------------------------------------------------------------------- OB History  Gravidity:    5         Term:   3        Prem:   1  Living:       3 ---------------------------------------------------------------------- Gestational Age  U/S Today:     36w 1d                                        EDD:   05/13/18  Best:          38w 2d     Det. By:  Previous Ultrasound      EDD:   04/28/18                                       (09/19/17) ---------------------------------------------------------------------- Anatomy  Cranium:               Appears normal         Aortic Arch:            Previously seen  Cavum:  Appears normal         Ductal Arch:            Not well visualized  Ventricles:            Appears normal         Diaphragm:              Appears normal  Choroid Plexus:        Previously seen        Stomach:                Appears normal, left                                                                        sided  Cerebellum:            Previously seen        Abdomen:                Appears normal  Posterior Fossa:       Previously seen        Abdominal Wall:         Previously seen  Nuchal Fold:           Not applicable (>20    Cord Vessels:           Previously seen                         wks GA)  Face:                  Orbits and profile     Kidneys:                Appear normal                         previously seen  Lips:                  Previously seen        Bladder:                Appears normal  Thoracic:              Appears normal         Spine:                  Limited views                                                                        appear normal  Heart:                 Previously seen        Upper Extremities:      Previously seen  RVOT:                  Previously seen  Lower Extremities:      Previously seen  LVOT:                  Previously seen  Other:  Female gender Technically difficult due to advanced GA and fetal          position. ---------------------------------------------------------------------- Cervix Uterus Adnexa  Cervix  Not visualized (advanced GA >24wks) ---------------------------------------------------------------------- Impression  The estimated fetal weight is at greater than the 90th  percentile. Abdominal circumference measures at greater  than the 95th percentile. Amniotic fluid is normal and good  fetal activity is seen.  History of GDM. ---------------------------------------------------------------------- Recommendations  Antenatal testing next week if undelivered. ----------------------------------------------------------------------                  Noralee Space, MD Electronically Signed Final Report   04/16/2018 04:59 pm ----------------------------------------------------------------------   Assessment and Plan:  Pregnancy: [id number] at [redacted]w[redacted]d  1. Genital herpes affecting pregnancy, antepartum Continue Valtrex suppression.  2. Group B streptococcal bacteriuria Will do intrapartum prophylaxis.  3. Supervision of high risk pregnancy, antepartum Term labor symptoms and general obstetric precautions including but not limited to vaginal bleeding, contractions, leaking of fluid and fetal movement were reviewed in detail with the patient. Please refer to After Visit Summary for other counseling recommendations.  Return in about 1 week (around 04/24/2018).  No future appointments.  Jaynie Collins, MD

## 2018-04-26 ENCOUNTER — Ambulatory Visit (INDEPENDENT_AMBULATORY_CARE_PROVIDER_SITE_OTHER): Payer: Self-pay | Admitting: Obstetrics & Gynecology

## 2018-04-26 VITALS — BP 124/81 | HR 86 | Wt 179.0 lb

## 2018-04-26 DIAGNOSIS — A6009 Herpesviral infection of other urogenital tract: Secondary | ICD-10-CM

## 2018-04-26 DIAGNOSIS — O98313 Other infections with a predominantly sexual mode of transmission complicating pregnancy, third trimester: Secondary | ICD-10-CM

## 2018-04-26 DIAGNOSIS — O0990 Supervision of high risk pregnancy, unspecified, unspecified trimester: Secondary | ICD-10-CM

## 2018-04-26 DIAGNOSIS — O09523 Supervision of elderly multigravida, third trimester: Secondary | ICD-10-CM

## 2018-04-26 DIAGNOSIS — O09299 Supervision of pregnancy with other poor reproductive or obstetric history, unspecified trimester: Secondary | ICD-10-CM

## 2018-04-26 NOTE — Progress Notes (Signed)
   PRENATAL VISIT NOTE  Subjective:  Jennifer Bowman is a 37 y.o. [phone] at [redacted]w[redacted]d being seen today for ongoing prenatal care.  She is currently monitored for the following issues for this high-risk pregnancy and has Supervision of high risk pregnancy, antepartum; Group B streptococcal bacteriuria; History of fetal abnormality in previous pregnancy resulting in fetal demise, currently pregnant; AMA (advanced maternal age) multigravida 35+; and Genital herpes affecting pregnancy on their problem list.  Patient reports no complaints.  Contractions: Irregular. Vag. Bleeding: None.  Movement: Present. Denies leaking of fluid.   The following portions of the patient's history were reviewed and updated as appropriate: allergies, current medications, past family history, past medical history, past social history, past surgical history and problem list. Problem list updated.  Objective:   Vitals:   04/26/18 0938  BP: 124/81  Pulse: 86  Weight: 179 lb (81.2 kg)    Fetal Status: Fetal Heart Rate (bpm): 131   Movement: Present     General:  Alert, oriented and cooperative. Patient is in no acute distress.  Skin: Skin is warm and dry. No rash noted.   Cardiovascular: Normal heart rate noted  Respiratory: Normal respiratory effort, no problems with respiration noted  Abdomen: Soft, gravid, appropriate for gestational age.  Pain/Pressure: Present     Pelvic: Cervical exam performed        Extremities: Normal range of motion.  Edema: None  Mental Status: Normal mood and affect. Normal behavior. Normal judgment and thought content.   Assessment and Plan:  Pregnancy: [id number] at [redacted]w[redacted]d  1. Supervision of high risk pregnancy, antepartum   2. History of fetal abnormality in previous pregnancy resulting in fetal demise, currently pregnant   3. Multigravida of advanced maternal age in third trimester  4. Genital herpes affecting pregnancy in third trimester - on valtrex  Term labor  symptoms and general obstetric precautions including but not limited to vaginal bleeding, contractions, leaking of fluid and fetal movement were reviewed in detail with the patient. Please refer to After Visit Summary for other counseling recommendations.  No follow-ups on file.  No future appointments.  Allie Bossier, MD

## 2018-04-28 ENCOUNTER — Encounter (HOSPITAL_COMMUNITY): Payer: Self-pay

## 2018-04-28 ENCOUNTER — Inpatient Hospital Stay (HOSPITAL_COMMUNITY)
Admission: AD | Admit: 2018-04-28 | Discharge: 2018-04-30 | DRG: 806 | Disposition: A | Discharge status: Home / Self Care | Payer: Medicaid Other | Attending: Obstetrics and Gynecology | Admitting: Obstetrics and Gynecology

## 2018-04-28 DIAGNOSIS — Z3A40 40 weeks gestation of pregnancy: Secondary | ICD-10-CM

## 2018-04-28 DIAGNOSIS — O99824 Streptococcus B carrier state complicating childbirth: Secondary | ICD-10-CM | POA: Diagnosis present

## 2018-04-28 DIAGNOSIS — O0990 Supervision of high risk pregnancy, unspecified, unspecified trimester: Secondary | ICD-10-CM

## 2018-04-28 DIAGNOSIS — Z3483 Encounter for supervision of other normal pregnancy, third trimester: Secondary | ICD-10-CM | POA: Diagnosis present

## 2018-04-28 DIAGNOSIS — B951 Streptococcus, group B, as the cause of diseases classified elsewhere: Secondary | ICD-10-CM

## 2018-04-28 DIAGNOSIS — O09523 Supervision of elderly multigravida, third trimester: Secondary | ICD-10-CM

## 2018-04-28 DIAGNOSIS — O09299 Supervision of pregnancy with other poor reproductive or obstetric history, unspecified trimester: Secondary | ICD-10-CM

## 2018-04-28 DIAGNOSIS — R8271 Bacteriuria: Secondary | ICD-10-CM

## 2018-04-28 LAB — TYPE AND SCREEN
ABO/RH(D): A POS
Antibody Screen: NEGATIVE

## 2018-04-28 LAB — CBC
HCT: 38.6 % (ref 36.0–46.0)
HEMOGLOBIN: 12.8 g/dL (ref 12.0–15.0)
MCH: 30.5 pg (ref 26.0–34.0)
MCHC: 33.2 g/dL (ref 30.0–36.0)
MCV: 92.1 fL (ref 80.0–100.0)
Platelets: 256 10*3/uL (ref 150–400)
RBC: 4.19 MIL/uL (ref 3.87–5.11)
RDW: 14.9 % (ref 11.5–15.5)
WBC: 8.4 10*3/uL (ref 4.0–10.5)
nRBC: 0 % (ref 0.0–0.2)

## 2018-04-28 LAB — RPR: RPR Ser Ql: NONREACTIVE

## 2018-04-28 LAB — ABO/RH: ABO/RH(D): A POS

## 2018-04-28 MED ORDER — TETANUS-DIPHTH-ACELL PERTUSSIS 5-2.5-18.5 LF-MCG/0.5 IM SUSP: 0.5000 mL | Freq: Once | INTRAMUSCULAR | Status: DC

## 2018-04-28 MED ORDER — FENTANYL CITRATE (PF) 100 MCG/2ML IJ SOLN
100.0000 ug | INTRAMUSCULAR | Status: DC | PRN
  Administered 2018-04-28: 100 ug via INTRAVENOUS
  Filled 2018-04-28: qty 2

## 2018-04-28 MED ORDER — ONDANSETRON HCL 4 MG PO TABS: 4.0000 mg | ORAL_TABLET | ORAL | Status: DC | PRN

## 2018-04-28 MED ORDER — LACTATED RINGERS IV SOLN: 500.0000 mL | INTRAVENOUS | Status: DC | PRN

## 2018-04-28 MED ORDER — OXYTOCIN 40 UNITS IN LACTATED RINGERS INFUSION - SIMPLE MED
INTRAVENOUS | Status: AC
  Filled 2018-04-28: qty 1000

## 2018-04-28 MED ORDER — BENZOCAINE-MENTHOL 20-0.5 % EX AERO
1.0000 "application " | INHALATION_SPRAY | CUTANEOUS | Status: DC | PRN
  Administered 2018-04-28: 1 via TOPICAL
  Filled 2018-04-28: qty 56

## 2018-04-28 MED ORDER — ONDANSETRON HCL 4 MG/2ML IJ SOLN: 4.0000 mg | Freq: Four times a day (QID) | INTRAMUSCULAR | Status: DC | PRN

## 2018-04-28 MED ORDER — LACTATED RINGERS IV SOLN
INTRAVENOUS | Status: DC
  Administered 2018-04-28: 06:00:00 via INTRAVENOUS

## 2018-04-28 MED ORDER — DIBUCAINE 1 % RE OINT: 1.0000 "application " | TOPICAL_OINTMENT | RECTAL | Status: DC | PRN

## 2018-04-28 MED ORDER — MISOPROSTOL 200 MCG PO TABS: 800.0000 ug | ORAL_TABLET | Freq: Once | ORAL | Status: DC

## 2018-04-28 MED ORDER — WITCH HAZEL-GLYCERIN EX PADS: 1.0000 "application " | MEDICATED_PAD | CUTANEOUS | Status: DC | PRN

## 2018-04-28 MED ORDER — SOD CITRATE-CITRIC ACID 500-334 MG/5ML PO SOLN: 30.0000 mL | ORAL | Status: DC | PRN

## 2018-04-28 MED ORDER — OXYTOCIN 40 UNITS IN LACTATED RINGERS INFUSION - SIMPLE MED: 2.5000 [IU]/h | INTRAVENOUS | Status: DC

## 2018-04-28 MED ORDER — METHYLERGONOVINE MALEATE 0.2 MG/ML IJ SOLN
INTRAMUSCULAR | Status: AC
  Filled 2018-04-28: qty 1

## 2018-04-28 MED ORDER — IBUPROFEN 600 MG PO TABS
600.0000 mg | ORAL_TABLET | Freq: Four times a day (QID) | ORAL | Status: DC
  Administered 2018-04-28 – 2018-04-30 (×9): 600 mg via ORAL
  Filled 2018-04-28 (×9): qty 1

## 2018-04-28 MED ORDER — OXYTOCIN BOLUS FROM INFUSION
500.0000 mL | Freq: Once | INTRAVENOUS | Status: AC
  Administered 2018-04-28: 500 mL via INTRAVENOUS

## 2018-04-28 MED ORDER — ONDANSETRON HCL 4 MG/2ML IJ SOLN: 4.0000 mg | INTRAMUSCULAR | Status: DC | PRN

## 2018-04-28 MED ORDER — SIMETHICONE 80 MG PO CHEW: 80.0000 mg | CHEWABLE_TABLET | ORAL | Status: DC | PRN

## 2018-04-28 MED ORDER — LIDOCAINE HCL (PF) 1 % IJ SOLN
30.0000 mL | INTRAMUSCULAR | Status: AC | PRN
  Administered 2018-04-28: 30 mL via SUBCUTANEOUS
  Filled 2018-04-28: qty 30

## 2018-04-28 MED ORDER — METHYLERGONOVINE MALEATE 0.2 MG/ML IJ SOLN
0.2000 mg | Freq: Once | INTRAMUSCULAR | Status: AC
  Administered 2018-04-28: 0.2 mg via INTRAMUSCULAR

## 2018-04-28 MED ORDER — COCONUT OIL OIL
1.0000 "application " | TOPICAL_OIL | Status: DC | PRN
  Administered 2018-04-29: 1 via TOPICAL
  Filled 2018-04-28: qty 120

## 2018-04-28 MED ORDER — ACETAMINOPHEN 325 MG PO TABS: 650.0000 mg | ORAL_TABLET | ORAL | Status: DC | PRN

## 2018-04-28 MED ORDER — DIPHENHYDRAMINE HCL 25 MG PO CAPS: 25.0000 mg | ORAL_CAPSULE | Freq: Four times a day (QID) | ORAL | Status: DC | PRN

## 2018-04-28 MED ORDER — FENTANYL CITRATE (PF) 100 MCG/2ML IJ SOLN
INTRAMUSCULAR | Status: AC
  Administered 2018-04-28: 100 ug
  Filled 2018-04-28: qty 2

## 2018-04-28 MED ORDER — SENNOSIDES-DOCUSATE SODIUM 8.6-50 MG PO TABS
2.0000 | ORAL_TABLET | ORAL | Status: DC
  Administered 2018-04-29 (×2): 2 via ORAL
  Filled 2018-04-28 (×2): qty 2

## 2018-04-28 MED ORDER — SODIUM CHLORIDE 0.9 % IV SOLN
2.0000 g | Freq: Once | INTRAVENOUS | Status: AC
  Administered 2018-04-28: 2 g via INTRAVENOUS
  Filled 2018-04-28: qty 2

## 2018-04-28 MED ORDER — PRENATAL MULTIVITAMIN CH
1.0000 | ORAL_TABLET | Freq: Every day | ORAL | Status: DC
  Administered 2018-04-28 – 2018-04-30 (×3): 1 via ORAL
  Filled 2018-04-28 (×3): qty 1

## 2018-04-28 MED ORDER — ZOLPIDEM TARTRATE 5 MG PO TABS: 5.0000 mg | ORAL_TABLET | Freq: Every evening | ORAL | Status: DC | PRN

## 2018-04-28 MED ORDER — MISOPROSTOL 200 MCG PO TABS
ORAL_TABLET | ORAL | Status: AC
  Filled 2018-04-28: qty 4

## 2018-04-28 NOTE — Progress Notes (Signed)
Dr Vear Clock notified of pt's admission and status. Aware of sve, hx IUFD, GBS pos. Will admit to [facility]

## 2018-04-28 NOTE — Progress Notes (Signed)
Interpreter used for [employer]. ID# [id number]

## 2018-04-28 NOTE — Progress Notes (Signed)
Spanish interpreter [phone] used for introduction and admission.   Lenox Ponds, RN

## 2018-04-28 NOTE — Lactation Note (Signed)
This note was copied from a baby's chart. Lactation Consultation Note  Patient Name: Jennifer Bowman Today's Date: 04/28/2018 Reason for consult: Initial assessment;Term  63 hours old FT female who is being exclusively BF by her mother, she's a P5. Mom is experienced BF, she was able to BF her other children for 6-10 months without any difficulties. Mom participated in the [facility] program at the [facility] during the pregnancy, she doesn't have a pump at home, [facility] offered one from the hospital. Pump instructions, cleaning and storage were reviewed as well as milk storage guidelines.  Mom just started feeding baby when entering the room, she was nursing baby in a typical cradle position, latch was slightly shallow. Advised mom to try cross cradle and she did shortly, a few audible swallows were heard when LC did some breast compressions. When revising hand expression with mom, colostrum was easily expressed of her nipples, mom has large nipples, she was concerned that they may be too big for baby's mouth. Reviewed tips for a deep latch, baby able to complete an 18 minutes feeding. Cluster feeding was discussed.  Feeding plan:  1. Encouraged mom to feed baby STS 8-12 times/24 hours or sooner if feeding cues are present 2. Hand expression and spoon feeding was also encouraged  BF Mom is Spanish speaker, BF brochure (SP), BF resources and feeding diary (SP) were reviewed. Noticed that mom had an [place] folder in her room, LC requested one in SP and NT promptly brought it in the room. Mom reported all questions and concerns were answered, she's aware of LC services and will call PRN.  Maternal Data Formula Feeding for Exclusion: No Has patient been taught Hand Expression?: Yes Does the patient have breastfeeding experience prior to this delivery?: Yes  Feeding Feeding Type: Breast Fed  LATCH Score Latch: Grasps breast easily, tongue down, lips flanged, rhythmical sucking.  Audible Swallowing: A  few with stimulation(with breast compressions)  Type of Nipple: Everted at rest and after stimulation  Comfort (Breast/Nipple): Soft / non-tender  Hold (Positioning): Assistance needed to correctly position infant at breast and maintain latch.  LATCH Score: 8  Interventions Interventions: Breast feeding basics reviewed;Assisted with latch;Skin to skin;Breast massage;Hand express;Breast compression;Adjust position;Position options;Support pillows;Hand pump  Lactation Tools Discussed/Used Tools: Pump Breast pump type: Manual WIC Program: Yes Pump Review: Setup, frequency, and cleaning;Milk Storage Initiated by:: MPeck Date initiated:: 04/28/18   Consult Status Consult Status: Follow-up Date: 04/29/18 Follow-up type: In-patient    Jennifer Bowman 04/28/2018, 6:37 PM

## 2018-04-28 NOTE — H&P (Addendum)
LABOR AND DELIVERY ADMISSION HISTORY AND PHYSICAL NOTE  Jennifer Bowman is a 37 y.o. female [phone] with IUP at [redacted]w[redacted]d presenting for SOL.   She reports positive fetal movement. She denies leakage of fluid or vaginal bleeding.  Prenatal History/Complications: PNC at [facility] Pregnancy complications:  - AMA - GBS bacteriuria - History of IUFD - History of PPH  Past Medical History: Past Medical History:  Diagnosis Date  . Genital warts   . Gestational diabetes     Past Surgical History: Past Surgical History:  Procedure Laterality Date  . CHOLECYSTECTOMY  2018    Obstetrical History: OB History    Gravida  5   Para  4   Term  3   Preterm  1   AB      Living  3     SAB      TAB      Ectopic      Multiple      Live Births  3           Social History: Social History   Socioeconomic History  . Marital status: Married    Spouse name: Not on file  . Number of children: Not on file  . Years of education: Not on file  . Highest education level: Not on file  Occupational History  . Not on file  Social Needs  . Financial resource strain: Not on file  . Food insecurity:    Worry: Not on file    Inability: Not on file  . Transportation needs:    Medical: Not on file    Non-medical: Not on file  Tobacco Use  . Smoking status: Never Smoker  . Smokeless tobacco: Never Used  Substance and Sexual Activity  . Alcohol use: Not Currently    Comment: social  . Drug use: Never  . Sexual activity: Yes    Birth control/protection: None  Lifestyle  . Physical activity:    Days per week: Not on file    Minutes per session: Not on file  . Stress: Not on file  Relationships  . Social connections:    Talks on phone: Not on file    Gets together: Not on file    Attends religious service: Not on file    Active member of club or organization: Not on file    Attends meetings of clubs or organizations: Not on file    Relationship status: Not on file   Other Topics Concern  . Not on file  Social History Narrative  . Not on file    Family History: Family History  Problem Relation Age of Onset  . Hypertension Sister   . Diabetes Sister   . Heart disease Sister   . Seizures Sister   . Hepatitis Sister   . Thyroid disease Sister     Allergies: No Known Allergies  Medications Prior to Admission  Medication Sig Dispense Refill Last Dose  . Prenatal Multivit-Min-Fe-FA (PRENATAL VITAMINS PO) Take 1 tablet by mouth daily.   Taking  . valACYclovir (VALTREX) 1000 MG tablet Take 500 mg by mouth 2 (two) times daily.   Taking     Review of Systems  All systems reviewed and negative except as stated in HPI  Physical Exam Blood pressure 124/70, pulse (!) 104, temperature 98.7 F (37.1 C), temperature source Oral, resp. rate 18, height 5\' 3"  (1.6 m), weight 82.6 kg, last menstrual period 07/23/2017. General appearance: alert, oriented, NAD Lungs: normal respiratory effort Heart:  regular rate Abdomen: soft, non-tender; gravid, FH appropriate for GA Extremities: No calf swelling or tenderness Presentation: cephalic Fetal monitoring: Cat 1 Uterine activity: q70min Dilation: 10 Effacement (%): 100 Station: Plus 1 Exam by:: Dr. Laymond Purser  Prenatal labs: ABO, Rh: --/--/A POS, A POS Performed at [facility], [address] [place], [place] [address]  [phone]/03 0534) Antibody: NEG (11/03 0534) Rubella: Immune (08/26 0000) RPR: Nonreactive (08/26 0000)  HBsAg: Negative (08/26 0000)  HIV:   Negative GC/Chlamydia: Pending GBS:   GBS Bacteriruia 2-hr GTT: Passed 1hr Genetic screening:  Quad negative Anatomy US: Normal female  Prenatal Transfer Tool  Maternal Diabetes: No Genetic Screening: Normal Maternal Ultrasounds/Referrals: Normal Fetal Ultrasounds or other Referrals:  None Maternal Substance Abuse:  No Significant Maternal Medications:  None Significant Maternal Lab Results: None  Results for orders placed or  performed during the hospital encounter of 04/28/18 (from the past 24 hour(s))  CBC   Collection Time: 04/28/18  5:34 AM  Result Value Ref Range   WBC 8.4 4.0 - 10.5 K/uL   RBC 4.19 3.87 - 5.11 MIL/uL   Hemoglobin 12.8 12.0 - 15.0 g/dL   HCT 60.4 54.0 - 98.1 %   MCV 92.1 80.0 - 100.0 fL   MCH 30.5 26.0 - 34.0 pg   MCHC 33.2 30.0 - 36.0 g/dL   RDW 19.1 47.8 - 29.5 %   Platelets 256 150 - 400 K/uL   nRBC 0.0 0.0 - 0.2 %  Type and screen [facility]   Collection Time: 04/28/18  5:34 AM  Result Value Ref Range   ABO/RH(D) A POS    Antibody Screen NEG    Sample Expiration      05/01/2018 Performed at [facility], [address] [place], [place] [address]   ABO/Rh   Collection Time: 04/28/18  5:34 AM  Result Value Ref Range   ABO/RH(D)      A POS Performed at [facility], [address], [place], [place] [address]     Patient Active Problem List   Diagnosis Date Noted  . Normal labor 04/28/2018  . Genital herpes affecting pregnancy 03/21/2018  . Supervision of high risk pregnancy, antepartum 03/07/2018  . Group B streptococcal bacteriuria 03/07/2018  . History of fetal abnormality in previous pregnancy resulting in fetal demise, currently pregnant 03/07/2018  . AMA (advanced maternal age) multigravida 35+ 03/07/2018    Assessment: Terry Abila is a 37 y.o. [phone] at [redacted]w[redacted]d here for SOL  #Labor: Expectant management #Pain:  IV fentanyl #FWB: Cat 1 #ID:  Ampicillin for GBS bacteriuria, likely precipitous labor #MOF: Breast and bottle #MOC:condoms +/- vasectomy #Circ:  NA  Terisa Starr, MD 04/28/2018, 8:01 AM   OB FELLOW HISTORY AND PHYSICAL ATTESTATION  I have seen and examined this patient; I agree with above documentation in the resident's note.   Gwenevere Abbot, MD OB Fellow  04/28/2018, 8:20 AM

## 2018-04-28 NOTE — Progress Notes (Signed)
[facility] Interpreter, Alexanjdra, at bedside for delivery of baby and immediate post partum care. Breakfast ordered, questions answered, explained what recovery will consist of. Interpreter will be called back in when it is time to get the pt up to the bathroom and transfer to [facility].

## 2018-04-29 ENCOUNTER — Other Ambulatory Visit: Payer: Self-pay

## 2018-04-29 LAB — CBC
HEMATOCRIT: 29.8 % — AB (ref 36.0–46.0)
HEMOGLOBIN: 9.9 g/dL — AB (ref 12.0–15.0)
MCH: 30.3 pg (ref 26.0–34.0)
MCHC: 33.2 g/dL (ref 30.0–36.0)
MCV: 91.1 fL (ref 80.0–100.0)
Platelets: 202 10*3/uL (ref 150–400)
RBC: 3.27 MIL/uL — AB (ref 3.87–5.11)
RDW: 14.6 % (ref 11.5–15.5)
WBC: 8.1 10*3/uL (ref 4.0–10.5)

## 2018-04-29 NOTE — Progress Notes (Signed)
Post Partum Day 1, s/p SVD 04/28/18 @ 0729 Subjective: no complaints, up ad lib, voiding, tolerating PO and + flatus Patient continues to be concerned about angle of infant's foot. Noted at delivery and being followed be Peds.  Objective: Blood pressure (!) 95/54, pulse 83, temperature 97.8 F (36.6 C), temperature source Oral, resp. rate 16, height 5\' 3"  (1.6 m), weight 82.6 kg, last menstrual period 07/23/2017, SpO2 99 %, unknown if currently breastfeeding.  Physical Exam:  General: alert, cooperative, appears stated age and no distress Lochia: appropriate Uterine Fundus: firm Incision: N/A DVT Evaluation: No evidence of DVT seen on physical exam.  Recent Labs    04/28/18 0534 04/29/18 0558  HGB 12.8 9.9*  HCT 38.6 29.8*    Assessment/Plan: Plan for discharge tomorrow   LOS: 1 day   Calvert Cantor, CNM 04/29/2018, 10:21 AM

## 2018-04-30 LAB — HIV ANTIBODY (ROUTINE TESTING W REFLEX): HIV Screen 4th Generation wRfx: NONREACTIVE

## 2018-04-30 MED ORDER — IBUPROFEN 600 MG PO TABS: 600.0000 mg | ORAL_TABLET | Freq: Four times a day (QID) | ORAL | 0 refills | Status: AC

## 2018-04-30 NOTE — Lactation Note (Signed)
This note was copied from a baby's chart. Lactation Consultation Note: I had [facility] interpreter Lisette Abu [phone] on the phone for my visit. Mom reports baby has been nursing well. Has been giving formula also because baby was losing weight. Encouraged to always breast feed first then give formula. Able to hand express drops of Colostrum- encouragement given,. No questions at present.  Patient Name: Jennifer Bowman Today's Date: 04/30/2018 Reason for consult: Follow-up assessment   Maternal Data Formula Feeding for Exclusion: Yes Reason for exclusion: Mother's choice to formula and breast feed on admission Has patient been taught Hand Expression?: Yes Does the patient have breastfeeding experience prior to this delivery?: Yes  Feeding Feeding Type: Bottle Fed - Formula Nipple Type: Slow - flow  LATCH Score                   Interventions    Lactation Tools Discussed/Used     Consult Status Consult Status: PRN    Pamelia Hoit 04/30/2018, 11:01 AM

## 2018-04-30 NOTE — Discharge Instructions (Signed)

## 2018-04-30 NOTE — Discharge Summary (Addendum)
Postpartum Discharge Summary     Patient Name: Jennifer Bowman DOB: 11/23/1980 MRN: [id number]  Date of admission: 04/28/2018 Delivering Provider: Terisa Starr E   Date of discharge: 04/30/2018  Admitting diagnosis: 40wks ctxs 5 mins apart Intrauterine pregnancy: [redacted]w[redacted]d     Secondary diagnosis:  Active Problems:   Normal labor   Postpartum care following vaginal delivery  Additional problems: AMA, GBS+(inadequate treatment), h/o IUFD, h/o PPH, h/o gential warts, h/o GDM     Discharge diagnosis: Term Pregnancy Delivered                                                                                                Post partum procedures:none  Augmentation: none  Complications: None  Hospital course:  Onset of Labor With Vaginal Delivery     37 y.o. yo [id number] at [redacted]w[redacted]d was admitted in Active Labor on 04/28/2018. Patient had an uncomplicated labor course as follows:  Membrane Rupture Time/Date: 7:28 AM ,04/28/2018   Intrapartum Procedures: Episiotomy: None [1]                                         Lacerations:  2nd degree [3]  Patient had a delivery of a Viable infant. 04/28/2018  Information for the patient's newborn:  Jennifer, Scearce Girl Bowman [[id number]]  Delivery Method: Vaginal, Spontaneous(Filed from Delivery Summary)    Pateint had an uncomplicated postpartum course.  She is ambulating, tolerating a regular diet, passing flatus, and urinating well. Patient is discharged home in stable condition on 04/30/18.   Magnesium Sulfate recieved: No BMZ received: No  Physical exam  Vitals:   04/29/18 0532 04/29/18 1409 04/29/18 2246 04/30/18 0500  BP: (!) 95/54 (!) 93/54 (!) 89/67 101/73  Pulse: 83 94 99 91  Resp: 16 17 18 16   Temp: 97.8 F (36.6 C) 97.7 F (36.5 C) 98.5 F (36.9 C) 98.1 F (36.7 C)  TempSrc: Oral Oral Oral Oral  SpO2: 99%  98%   Weight:      Height:       General: alert and cooperative Lochia: appropriate Uterine Fundus: firm Incision:  N/A DVT Evaluation: No evidence of DVT seen on physical exam. No cords or calf tenderness. No significant calf/ankle edema. Labs: Lab Results  Component Value Date   WBC 8.1 04/29/2018   HGB 9.9 (L) 04/29/2018   HCT 29.8 (L) 04/29/2018   MCV 91.1 04/29/2018   PLT 202 04/29/2018   No flowsheet data found.  Discharge instruction: per After Visit Summary and "Baby and Me Booklet".  After visit meds:  Allergies as of 04/30/2018   No Known Allergies     Medication List    TAKE these medications   ibuprofen 600 MG tablet Commonly known as:  ADVIL,MOTRIN Take 1 tablet (600 mg total) by mouth every 6 (six) hours.   PRENATAL VITAMINS PO Take 1 tablet by mouth daily.   valACYclovir 1000 MG tablet Commonly known as:  VALTREX Take 500 mg by mouth 2 (two) times daily.  Diet: routine diet  Activity: Advance as tolerated. Pelvic rest for 6 weeks.   Outpatient follow up:4 weeks Follow up Appt: Future Appointments  Date Time Provider Department Center  06/11/2018  3:55 PM Marylene Land, CNM [facility]   Follow up Visit: Follow-up Information    [facility] for [facility]. Go on 06/11/2018.   Specialty:  Obstetrics and Gynecology Why:  @3 :55PM Contact information: [address] [place] [address] [phone]           Please schedule this patient for Postpartum visit in: 4 weeks with the following provider: MD For C/S patients schedule nurse incision check in weeks 2 weeks: no High risk pregnancy complicated by: AMA, GBS+(inadequate treatment), h/o IUFD, h/o PPH, h/o gential warts, h/o GDM Delivery mode:  SVD Anticipated Birth Control:  Condoms PP Procedures needed: 2 hour GTT  Schedule Integrated BH visit: no      Newborn Data: Live born female  Birth Weight: 8 lb 5.5 oz (3785 g) APGAR: 9, 9  Newborn Delivery   Birth date/time:  04/28/2018 07:29:00 Delivery type:  Vaginal, Spontaneous     Baby  Feeding: Bottle and Breast Disposition:home with mother   04/30/2018 Jennifer Manis, DO  PGY-2  OB FELLOW DISCHARGE ATTESTATION  I have seen and examined this patient and agree with above documentation in the resident's note.   Marcy Siren, D.O. OB Fellow  05/01/2018, 10:59 AM

## 2018-05-03 ENCOUNTER — Encounter: Payer: Self-pay | Admitting: Obstetrics & Gynecology

## 2018-06-11 ENCOUNTER — Ambulatory Visit: Payer: Self-pay | Admitting: Student

## 2019-09-10 IMAGING — US US MFM OB FOLLOW-UP
1 series · 14 of 28 positions shown · non-contrast
Comparison: none

[Series 1: us mfm ob follow-up · 31 acquisitions, 14 frames shown]
[im 2/31]
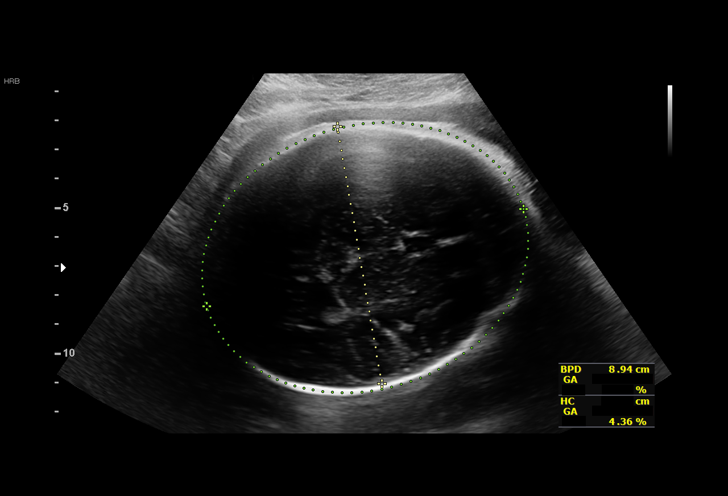
[im 4/31]
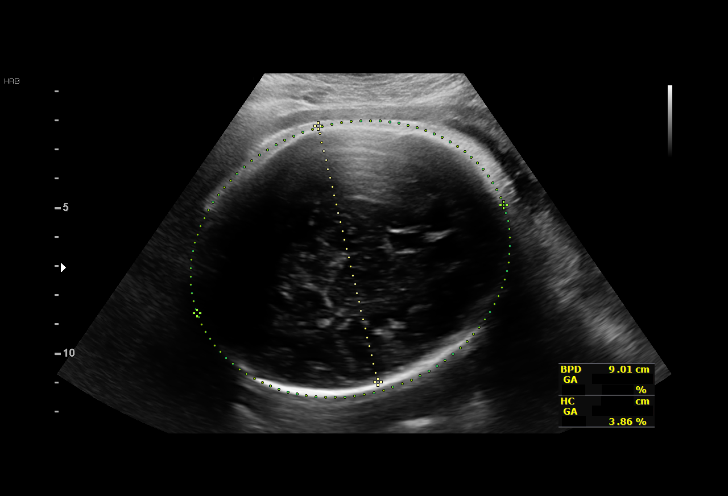
[im 6/31]
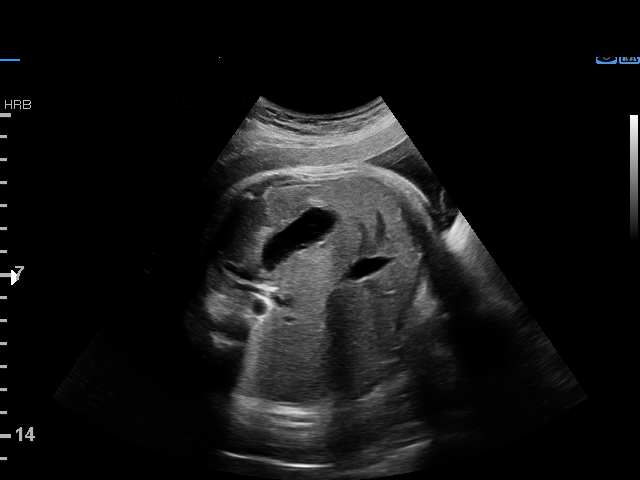
[im 8/31]
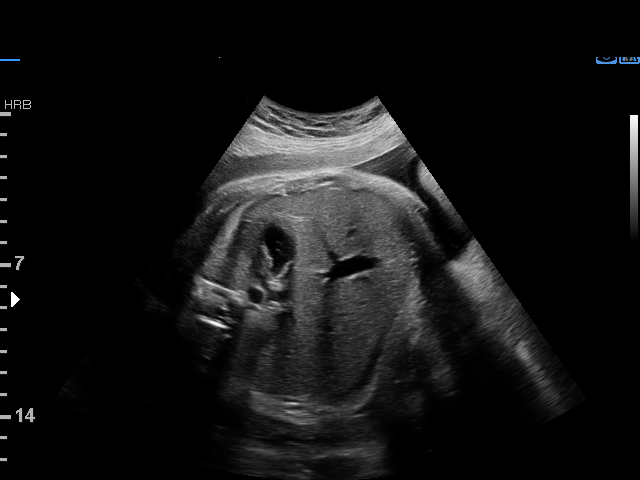
[im 11/31]
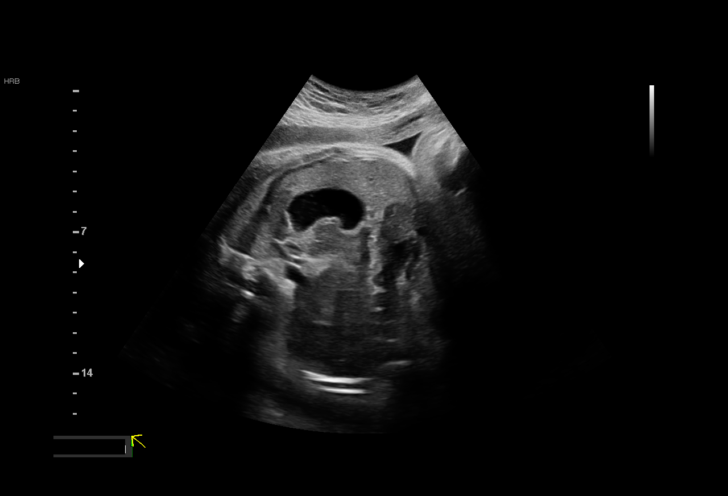
[im 13/31]
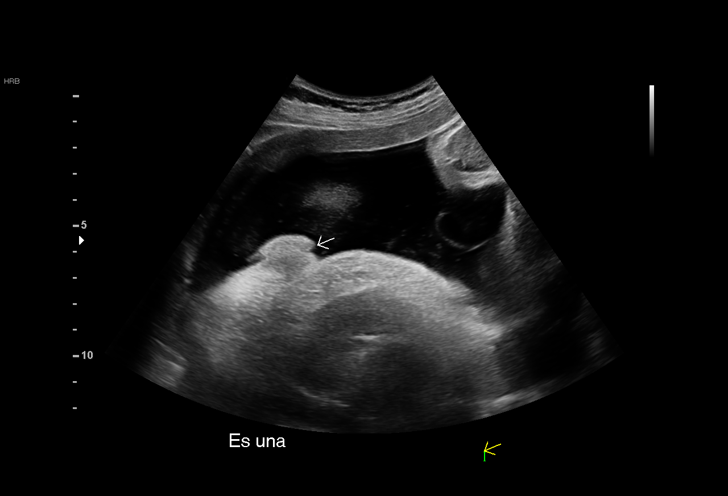
[im 15/31]
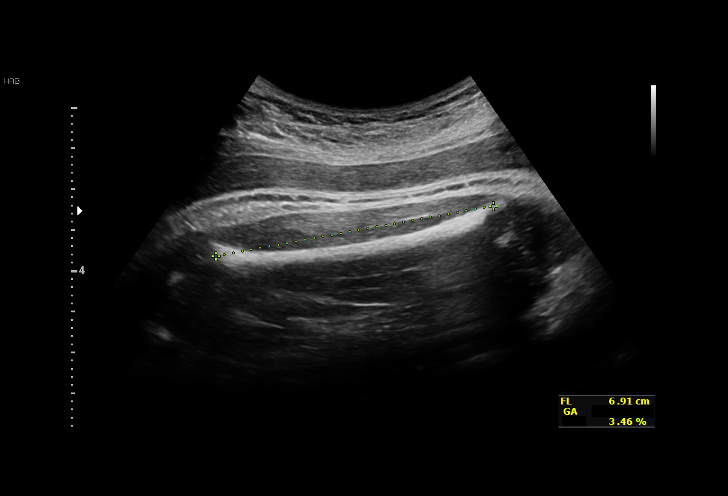
[im 17/31]
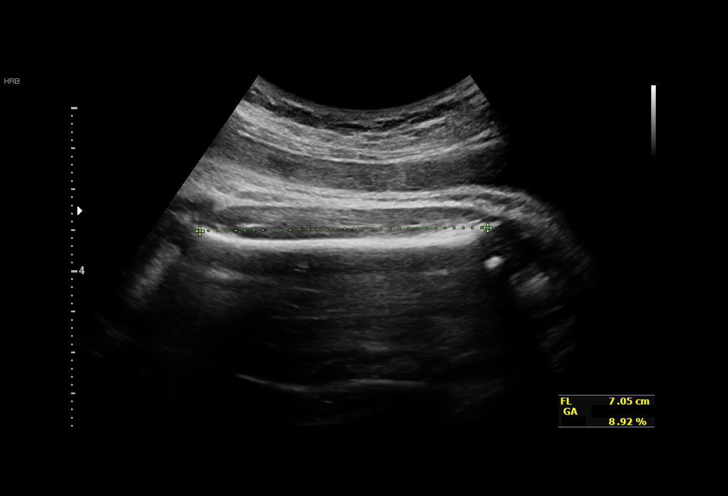
[im 19/31]
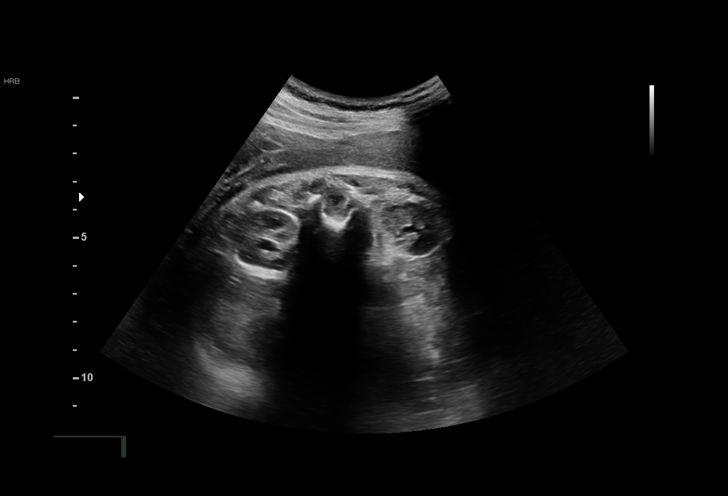
[im 22/31]
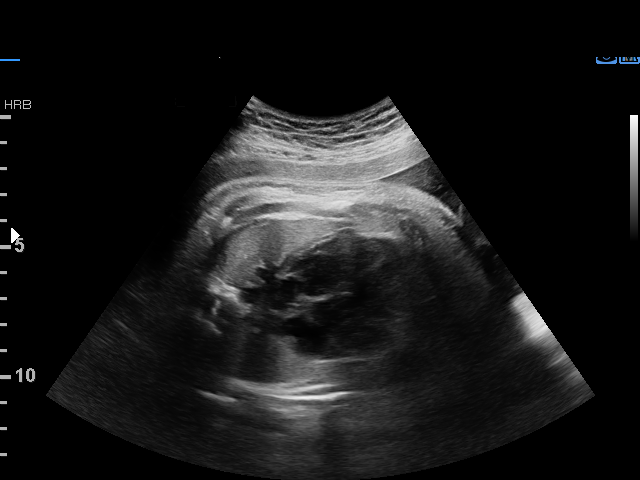
[im 24/31]
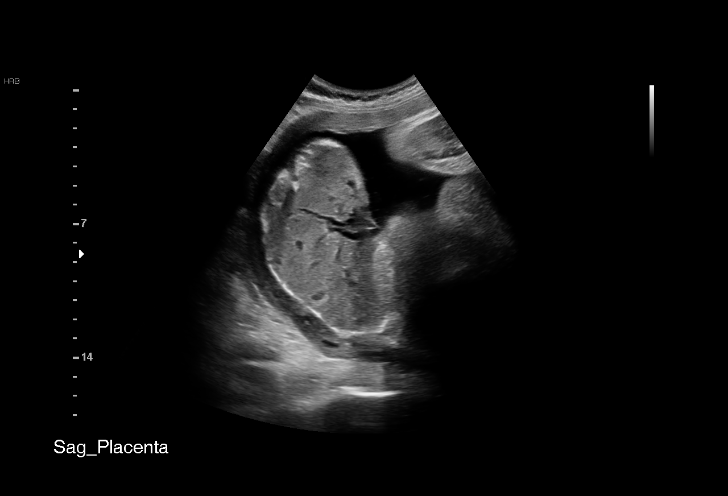
[im 26/31]
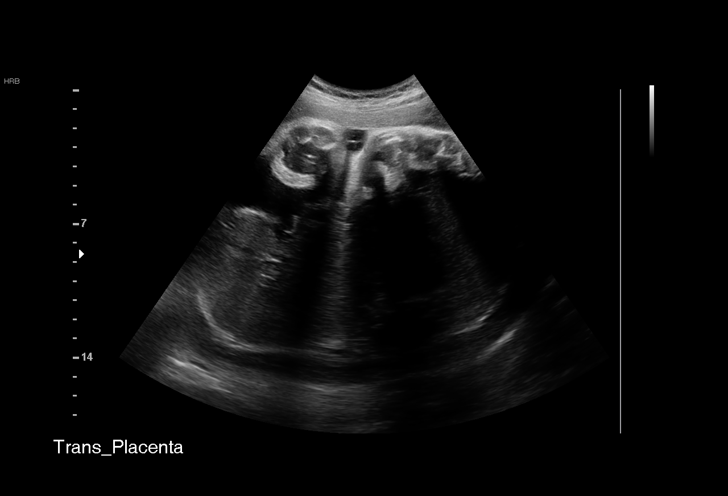
[im 28/31]
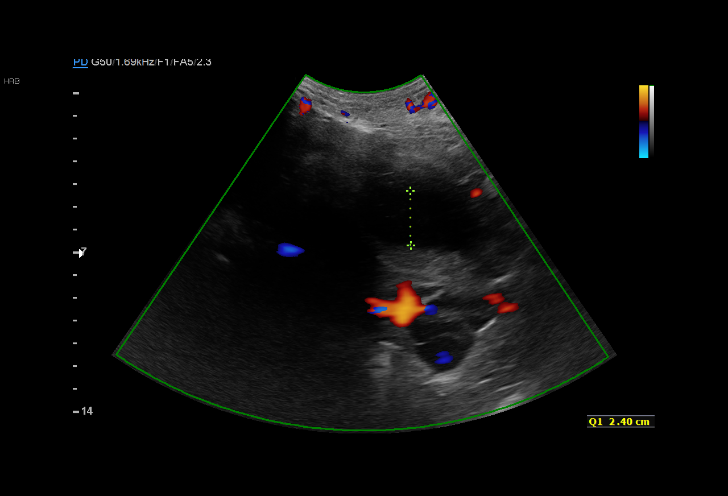
[im 31/31]
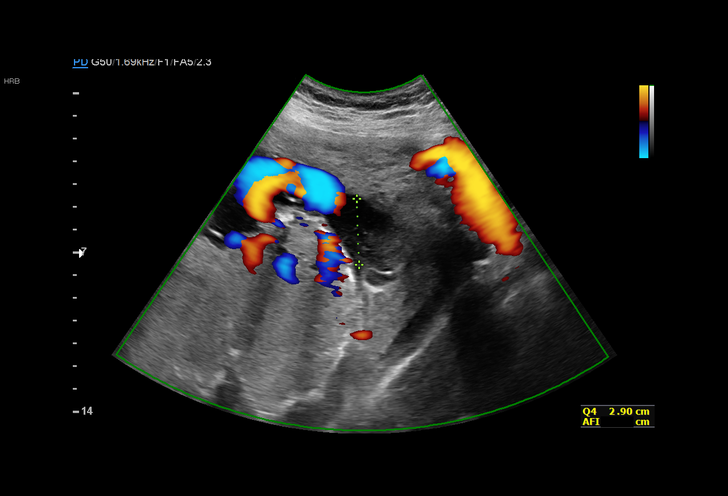

[14 of 28 positions shown; findings below may reference images not displayed]

Indications

38 weeks gestation of pregnancy
Poor obstetrical history (fetal demise @ 28
weeks - baby with anomalies)
Encounter for Strep B
Advanced maternal age multigravida 35+,
third trimester
Poor obstetric history: Previous gestational
diabetes
Encounter for other antenatal screening
follow-up
Macrosomia
Vital Signs

BMI:
Fetal Evaluation

Num Of Fetuses:         1
Fetal Heart Rate(bpm):  143
Cardiac Activity:       Observed
Presentation:           Cephalic
Placenta:               Posterior
P. Cord Insertion:      Previously Visualized

Amniotic Fluid
AFI FV:      Within normal limits
AFI Sum(cm)     %Tile       Largest Pocket(cm)
13.26           51

RUQ(cm)       RLQ(cm)       LUQ(cm)        LLQ(cm)
2.4
Biometry

BPD:      89.8  mm     G. Age:  36w 3d         24  %    CI:        77.42   %    70 - 86
FL/HC:      21.4   %    20.9 -
HC:      323.1  mm     G. Age:  36w 4d          6  %    HC/AC:      0.83        0.92 -
AC:      391.5  mm     G. Age:  N/A          > 97  %    FL/BPD:     77.1   %    71 - 87
FL:       69.2  mm     G. Age:  35w 4d          4  %    FL/AC:      17.7   %    20 - 24
HUM:        62  mm     G. Age:  35w 6d         32  %

Est. FW:    5615  gm    8 lb 12 oz    > 90  %
OB History

Gravidity:    5         Term:   3        Prem:   1
Living:       3
Gestational Age

U/S Today:     36w 1d                                        EDD:   05/13/18
Best:          38w 2d     Det. By:  Previous Ultrasound      EDD:   04/28/18
(09/19/17)
Anatomy

Cranium:               Appears normal         Aortic Arch:            Previously seen
Cavum:                 Appears normal         Ductal Arch:            Not well visualized
Ventricles:            Appears normal         Diaphragm:              Appears normal
Choroid Plexus:        Previously seen        Stomach:                Appears normal, left
sided
Cerebellum:            Previously seen        Abdomen:                Appears normal
Posterior Fossa:       Previously seen        Abdominal Wall:         Previously seen
Nuchal Fold:           Not applicable (>20    Cord Vessels:           Previously seen
wks GA)
Face:                  Orbits and profile     Kidneys:                Appear normal
previously seen
Lips:                  Previously seen        Bladder:                Appears normal
Thoracic:              Appears normal         Spine:                  Limited views
appear normal
Heart:                 Previously seen        Upper Extremities:      Previously seen
RVOT:                  Previously seen        Lower Extremities:      Previously seen
LVOT:                  Previously seen

Other:  Female gender Technically difficult due to advanced GA and fetal
position.
Cervix Uterus Adnexa

Cervix
Not visualized (advanced GA >32wks)
Impression

The estimated fetal weight is at greater than the 90th
percentile. Abdominal circumference measures at greater
than the 95th percentile. Amniotic fluid is normal and good
fetal activity is seen. History of GDM.
Recommendations

Antenatal testing next week if undelivered.
